# Patient Record
Sex: Female | Born: 1970
Health system: Southern US, Community
[De-identification: ages and names within clinical notes are randomized; demographics above are authoritative.]

## PROBLEM LIST (undated history)

## (undated) DIAGNOSIS — M5030 Other cervical disc degeneration, unspecified cervical region: Secondary | ICD-10-CM

## (undated) DIAGNOSIS — G43909 Migraine, unspecified, not intractable, without status migrainosus: Secondary | ICD-10-CM

## (undated) HISTORY — PX: AUGMENTATION MAMMAPLASTY: SUR837

## (undated) HISTORY — DX: Migraine, unspecified, not intractable, without status migrainosus: G43.909

## (undated) HISTORY — DX: Other cervical disc degeneration, unspecified cervical region: M50.30

---

## 1998-08-21 ENCOUNTER — Ambulatory Visit (HOSPITAL_COMMUNITY): Admission: RE | Admit: 1998-08-21 | Discharge: 1998-08-21 | Payer: Self-pay | Admitting: Obstetrics and Gynecology

## 1999-06-27 ENCOUNTER — Other Ambulatory Visit: Admission: RE | Admit: 1999-06-27 | Discharge: 1999-06-27 | Payer: Self-pay | Admitting: Obstetrics and Gynecology

## 2000-06-02 ENCOUNTER — Other Ambulatory Visit: Admission: RE | Admit: 2000-06-02 | Discharge: 2000-06-02 | Payer: Self-pay | Admitting: *Deleted

## 2000-09-17 ENCOUNTER — Ambulatory Visit (HOSPITAL_COMMUNITY): Admission: RE | Admit: 2000-09-17 | Discharge: 2000-09-17 | Payer: Self-pay | Admitting: *Deleted

## 2000-11-11 ENCOUNTER — Inpatient Hospital Stay (HOSPITAL_COMMUNITY): Admission: AD | Admit: 2000-11-11 | Discharge: 2000-11-11 | Payer: Self-pay | Admitting: Obstetrics & Gynecology

## 2000-11-12 ENCOUNTER — Inpatient Hospital Stay (HOSPITAL_COMMUNITY): Admission: AD | Admit: 2000-11-12 | Discharge: 2000-11-12 | Payer: Self-pay | Admitting: *Deleted

## 2000-11-13 ENCOUNTER — Inpatient Hospital Stay (HOSPITAL_COMMUNITY): Admission: AD | Admit: 2000-11-13 | Discharge: 2000-11-13 | Payer: Self-pay | Admitting: Obstetrics and Gynecology

## 2000-11-13 ENCOUNTER — Encounter: Payer: Self-pay | Admitting: Obstetrics and Gynecology

## 2000-11-27 ENCOUNTER — Inpatient Hospital Stay (HOSPITAL_COMMUNITY): Admission: AD | Admit: 2000-11-27 | Discharge: 2000-11-27 | Payer: Self-pay | Admitting: *Deleted

## 2000-12-17 ENCOUNTER — Inpatient Hospital Stay (HOSPITAL_COMMUNITY): Admission: AD | Admit: 2000-12-17 | Discharge: 2000-12-20 | Payer: Self-pay | Admitting: *Deleted

## 2001-01-23 ENCOUNTER — Other Ambulatory Visit: Admission: RE | Admit: 2001-01-23 | Discharge: 2001-01-23 | Payer: Self-pay | Admitting: *Deleted

## 2001-12-14 ENCOUNTER — Other Ambulatory Visit: Admission: RE | Admit: 2001-12-14 | Discharge: 2001-12-14 | Payer: Self-pay | Admitting: Obstetrics & Gynecology

## 2002-04-08 ENCOUNTER — Ambulatory Visit (HOSPITAL_COMMUNITY): Admission: RE | Admit: 2002-04-08 | Discharge: 2002-04-08 | Payer: Self-pay | Admitting: Obstetrics & Gynecology

## 2002-07-08 ENCOUNTER — Inpatient Hospital Stay (HOSPITAL_COMMUNITY): Admission: AD | Admit: 2002-07-08 | Discharge: 2002-07-09 | Payer: Self-pay | Admitting: Obstetrics & Gynecology

## 2002-08-19 ENCOUNTER — Other Ambulatory Visit: Admission: RE | Admit: 2002-08-19 | Discharge: 2002-08-19 | Payer: Self-pay | Admitting: Obstetrics & Gynecology

## 2003-08-24 ENCOUNTER — Other Ambulatory Visit: Admission: RE | Admit: 2003-08-24 | Discharge: 2003-08-24 | Payer: Self-pay | Admitting: Obstetrics & Gynecology

## 2004-02-29 ENCOUNTER — Other Ambulatory Visit: Admission: RE | Admit: 2004-02-29 | Discharge: 2004-02-29 | Payer: Self-pay | Admitting: Obstetrics and Gynecology

## 2004-02-29 ENCOUNTER — Other Ambulatory Visit: Admission: RE | Admit: 2004-02-29 | Discharge: 2004-02-29 | Payer: Self-pay | Admitting: Obstetrics & Gynecology

## 2004-06-26 ENCOUNTER — Ambulatory Visit (HOSPITAL_COMMUNITY): Admission: RE | Admit: 2004-06-26 | Discharge: 2004-06-26 | Payer: Self-pay | Admitting: Obstetrics & Gynecology

## 2004-09-06 ENCOUNTER — Inpatient Hospital Stay (HOSPITAL_COMMUNITY): Admission: AD | Admit: 2004-09-06 | Discharge: 2004-09-07 | Payer: Self-pay | Admitting: Obstetrics & Gynecology

## 2004-09-07 ENCOUNTER — Inpatient Hospital Stay (HOSPITAL_COMMUNITY): Admission: AD | Admit: 2004-09-07 | Discharge: 2004-09-07 | Payer: Self-pay | Admitting: Obstetrics and Gynecology

## 2004-09-12 ENCOUNTER — Inpatient Hospital Stay (HOSPITAL_COMMUNITY): Admission: AD | Admit: 2004-09-12 | Discharge: 2004-09-14 | Payer: Self-pay | Admitting: Obstetrics & Gynecology

## 2004-10-24 ENCOUNTER — Other Ambulatory Visit: Admission: RE | Admit: 2004-10-24 | Discharge: 2004-10-24 | Payer: Self-pay | Admitting: Obstetrics & Gynecology

## 2006-01-03 ENCOUNTER — Other Ambulatory Visit: Admission: RE | Admit: 2006-01-03 | Discharge: 2006-01-03 | Payer: Self-pay | Admitting: Obstetrics & Gynecology

## 2006-04-02 ENCOUNTER — Encounter: Payer: Self-pay | Admitting: Obstetrics and Gynecology

## 2007-04-30 ENCOUNTER — Ambulatory Visit (HOSPITAL_COMMUNITY): Admission: RE | Admit: 2007-04-30 | Discharge: 2007-04-30 | Payer: Self-pay | Admitting: Obstetrics & Gynecology

## 2007-07-11 ENCOUNTER — Inpatient Hospital Stay (HOSPITAL_COMMUNITY): Admission: AD | Admit: 2007-07-11 | Discharge: 2007-07-12 | Payer: Self-pay | Admitting: Obstetrics and Gynecology

## 2007-07-24 ENCOUNTER — Inpatient Hospital Stay (HOSPITAL_COMMUNITY): Admission: AD | Admit: 2007-07-24 | Discharge: 2007-07-26 | Payer: Self-pay | Admitting: Obstetrics and Gynecology

## 2011-09-13 LAB — RAPID HIV SCREEN (WH-MAU): Rapid HIV Screen: NONREACTIVE

## 2011-09-13 LAB — RH IMMUNE GLOB WKUP(>/=20WKS)(NOT WOMEN'S HOSP)

## 2011-09-13 LAB — CBC
HCT: 31.8 — ABNORMAL LOW
HCT: 35.4 — ABNORMAL LOW
Hemoglobin: 11.4 — ABNORMAL LOW
Hemoglobin: 12.4
MCHC: 35.1
MCHC: 35.8
MCV: 97.3
MCV: 97.5
Platelets: 116 — ABNORMAL LOW
Platelets: 150
RBC: 3.27 — ABNORMAL LOW
RBC: 3.63 — ABNORMAL LOW
RDW: 13.4
RDW: 13.4
WBC: 11.1 — ABNORMAL HIGH
WBC: 18 — ABNORMAL HIGH

## 2011-09-13 LAB — RPR: RPR Ser Ql: NONREACTIVE

## 2015-01-23 ENCOUNTER — Other Ambulatory Visit: Payer: Self-pay | Admitting: Obstetrics & Gynecology

## 2015-01-23 DIAGNOSIS — N6002 Solitary cyst of left breast: Secondary | ICD-10-CM

## 2015-01-23 DIAGNOSIS — N644 Mastodynia: Secondary | ICD-10-CM

## 2015-01-31 ENCOUNTER — Other Ambulatory Visit: Payer: Self-pay | Admitting: Obstetrics & Gynecology

## 2015-01-31 ENCOUNTER — Encounter (INDEPENDENT_AMBULATORY_CARE_PROVIDER_SITE_OTHER): Payer: Self-pay

## 2015-01-31 ENCOUNTER — Ambulatory Visit
Admission: RE | Admit: 2015-01-31 | Discharge: 2015-01-31 | Disposition: A | Discharge status: Home / Self Care | Payer: BLUE CROSS/BLUE SHIELD | Source: Ambulatory Visit | Attending: Obstetrics & Gynecology | Admitting: Obstetrics & Gynecology

## 2015-01-31 DIAGNOSIS — N6002 Solitary cyst of left breast: Secondary | ICD-10-CM

## 2015-01-31 DIAGNOSIS — N644 Mastodynia: Secondary | ICD-10-CM

## 2015-05-04 ENCOUNTER — Other Ambulatory Visit: Payer: Self-pay | Admitting: Obstetrics & Gynecology

## 2015-05-04 DIAGNOSIS — Z1231 Encounter for screening mammogram for malignant neoplasm of breast: Secondary | ICD-10-CM

## 2015-06-09 ENCOUNTER — Other Ambulatory Visit: Payer: Self-pay | Admitting: Obstetrics & Gynecology

## 2015-06-09 ENCOUNTER — Ambulatory Visit
Admission: RE | Admit: 2015-06-09 | Discharge: 2015-06-09 | Disposition: A | Discharge status: Home / Self Care | Payer: BLUE CROSS/BLUE SHIELD | Source: Ambulatory Visit | Attending: Obstetrics & Gynecology | Admitting: Obstetrics & Gynecology

## 2015-06-09 DIAGNOSIS — Z1231 Encounter for screening mammogram for malignant neoplasm of breast: Secondary | ICD-10-CM

## 2016-08-28 ENCOUNTER — Other Ambulatory Visit: Payer: Self-pay | Admitting: Obstetrics & Gynecology

## 2016-08-28 DIAGNOSIS — Z1231 Encounter for screening mammogram for malignant neoplasm of breast: Secondary | ICD-10-CM

## 2016-09-10 ENCOUNTER — Ambulatory Visit
Admission: RE | Admit: 2016-09-10 | Discharge: 2016-09-10 | Disposition: A | Discharge status: Home / Self Care | Payer: Commercial Managed Care - HMO | Source: Ambulatory Visit | Attending: Obstetrics & Gynecology | Admitting: Obstetrics & Gynecology

## 2016-09-10 ENCOUNTER — Encounter: Payer: Self-pay | Admitting: Radiology

## 2016-09-10 DIAGNOSIS — Z1231 Encounter for screening mammogram for malignant neoplasm of breast: Secondary | ICD-10-CM

## 2016-12-13 DIAGNOSIS — N390 Urinary tract infection, site not specified: Secondary | ICD-10-CM | POA: Diagnosis not present

## 2017-03-18 DIAGNOSIS — R102 Pelvic and perineal pain: Secondary | ICD-10-CM | POA: Diagnosis not present

## 2017-03-18 DIAGNOSIS — N6002 Solitary cyst of left breast: Secondary | ICD-10-CM | POA: Diagnosis not present

## 2017-03-19 ENCOUNTER — Other Ambulatory Visit: Payer: Self-pay | Admitting: Obstetrics & Gynecology

## 2017-03-19 DIAGNOSIS — N6320 Unspecified lump in the left breast, unspecified quadrant: Secondary | ICD-10-CM

## 2017-03-21 ENCOUNTER — Other Ambulatory Visit: Payer: Commercial Managed Care - HMO

## 2017-03-24 ENCOUNTER — Ambulatory Visit
Admission: RE | Admit: 2017-03-24 | Discharge: 2017-03-24 | Disposition: A | Discharge status: Home / Self Care | Payer: 59 | Source: Ambulatory Visit | Attending: Obstetrics & Gynecology | Admitting: Obstetrics & Gynecology

## 2017-03-24 ENCOUNTER — Other Ambulatory Visit: Payer: Self-pay | Admitting: Obstetrics & Gynecology

## 2017-03-24 DIAGNOSIS — N6320 Unspecified lump in the left breast, unspecified quadrant: Secondary | ICD-10-CM

## 2017-03-24 DIAGNOSIS — R928 Other abnormal and inconclusive findings on diagnostic imaging of breast: Secondary | ICD-10-CM | POA: Diagnosis not present

## 2017-03-24 DIAGNOSIS — N6489 Other specified disorders of breast: Secondary | ICD-10-CM | POA: Diagnosis not present

## 2017-04-08 ENCOUNTER — Other Ambulatory Visit: Payer: Self-pay | Admitting: Obstetrics & Gynecology

## 2017-04-08 DIAGNOSIS — Z803 Family history of malignant neoplasm of breast: Secondary | ICD-10-CM

## 2017-04-21 DIAGNOSIS — Z01419 Encounter for gynecological examination (general) (routine) without abnormal findings: Secondary | ICD-10-CM | POA: Diagnosis not present

## 2017-04-29 ENCOUNTER — Ambulatory Visit
Admission: RE | Admit: 2017-04-29 | Discharge: 2017-04-29 | Disposition: A | Discharge status: Home / Self Care | Payer: 59 | Source: Ambulatory Visit | Attending: Obstetrics & Gynecology | Admitting: Obstetrics & Gynecology

## 2017-04-29 DIAGNOSIS — Z803 Family history of malignant neoplasm of breast: Secondary | ICD-10-CM

## 2017-04-29 DIAGNOSIS — R922 Inconclusive mammogram: Secondary | ICD-10-CM | POA: Diagnosis not present

## 2017-04-29 MED ORDER — GADOBENATE DIMEGLUMINE 529 MG/ML IV SOLN
9.0000 mL | Freq: Once | INTRAVENOUS | Status: AC | PRN
  Administered 2017-04-29: 9 mL via INTRAVENOUS

## 2017-06-28 IMAGING — MR MR BILATERAL BREAST WITHOUT AND WITH CONTRAST
8 of 12 series · 33 of 48 positions shown · IV contrast (9ml multihance)
Comparison: Previous exam(s).

CLINICAL DATA: 45-year-old female with dense breasts and family
history of breast cancer. Left breast pain for 1 year.

LABS:  None
EXAM:
BILATERAL BREAST MRI WITH AND WITHOUT CONTRAST
TECHNIQUE: Multiplanar, multisequence MR images of both breasts were obtained
prior to and following the intravenous administration of 9 ml of
MultiHance.

[Series 2: t2_tirm_tra ipat (a-p) · axial · 3.0mm · 0.68mm/px · 1 of 56 slices shown]
[im 1/56]
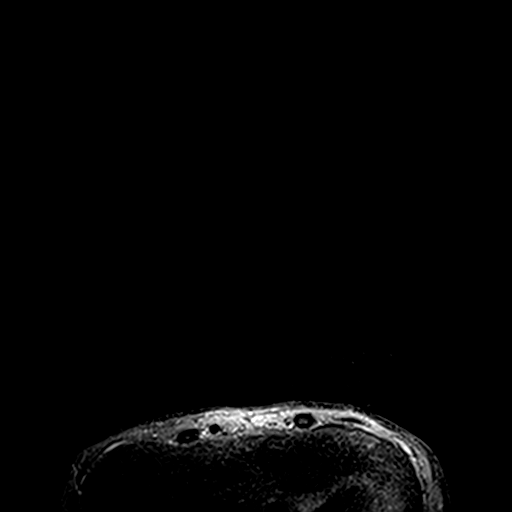

[Series 3: fl3d pre-cm no · axial · non-contrast · 1.2mm · 0.91mm/px · z∈[-67,+104]mm · 5 of 144 slices shown]
[im 1/144]
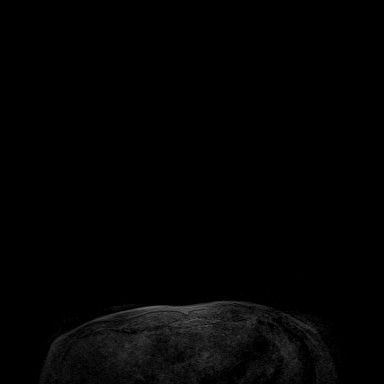
[im 36/144]
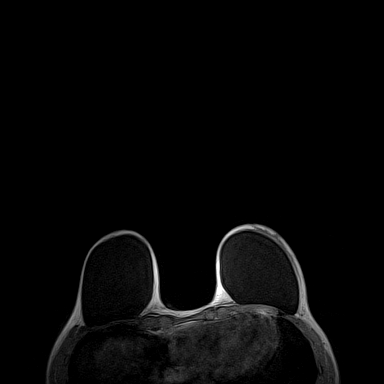
[im 72/144]
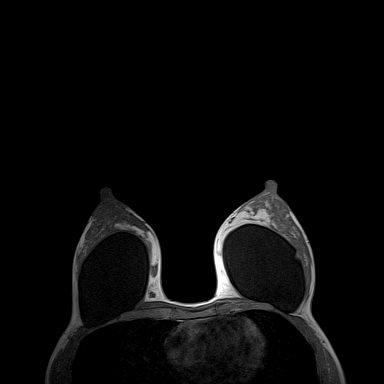
[im 108/144]
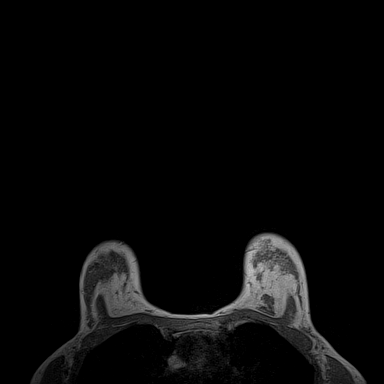
[im 144/144]
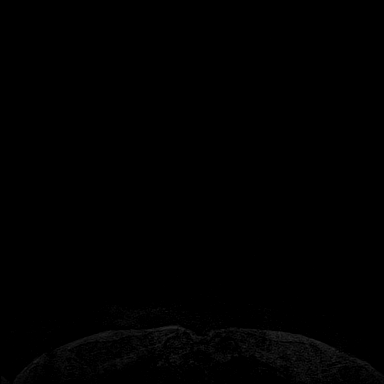

[Series 4: fl3d pre-cm · axial · non-contrast · 1.2mm · 0.91mm/px · z∈[-67,+104]mm · 5 of 144 slices shown]
[im 1/144]
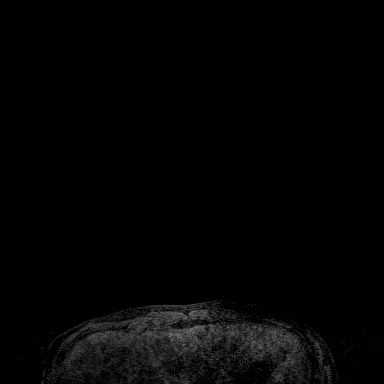
[im 36/144]
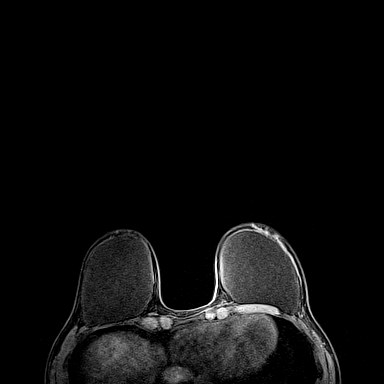
[im 72/144]
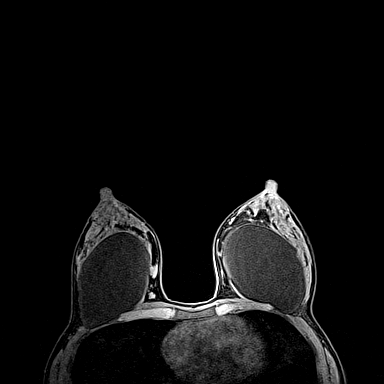
[im 108/144]
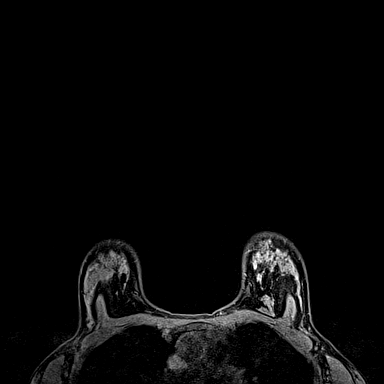
[im 144/144]
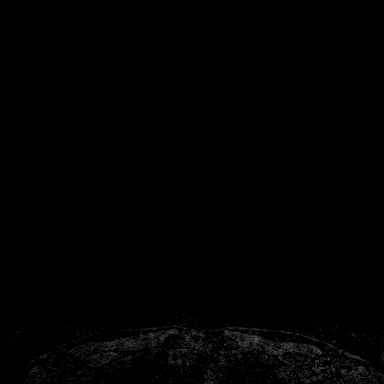

[Series 5: fl3d post immediate · axial · 1.2mm · 0.91mm/px · z∈[-67,+104]mm · 5 of 144 slices shown (1 of 3)]
[im 1/144]
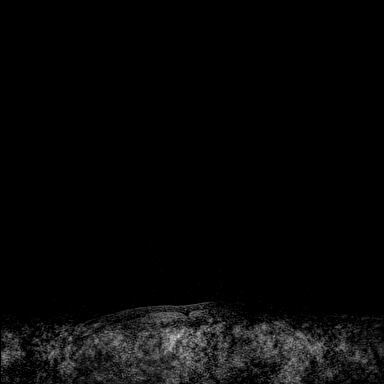
[im 36/144]
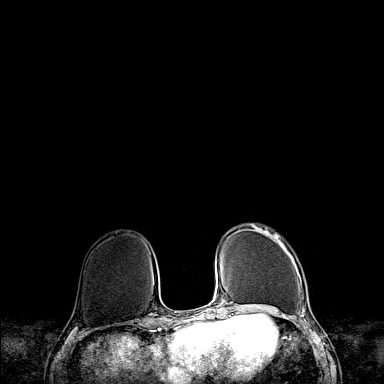
[im 72/144]
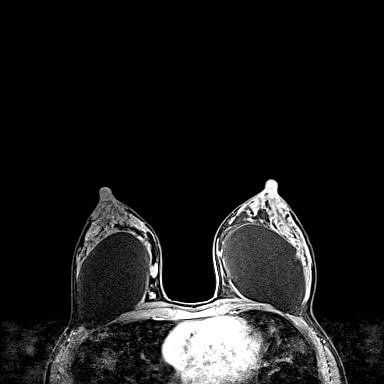
[im 108/144]
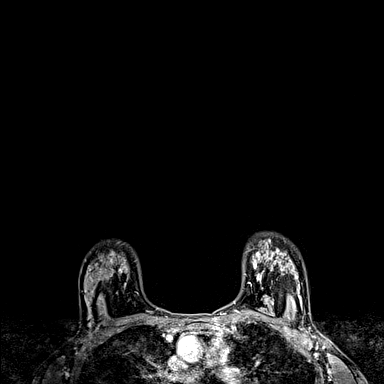
[im 144/144]
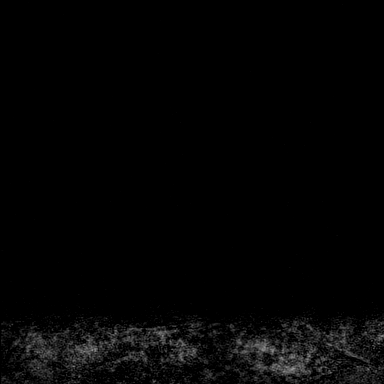

[Series 6: fl3d post immediate · axial · 1.2mm · 0.91mm/px · z∈[-67,+104]mm · 5 of 144 slices shown (2 of 3)]
[im 1/144]
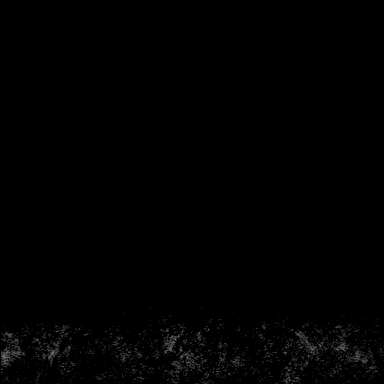
[im 36/144]
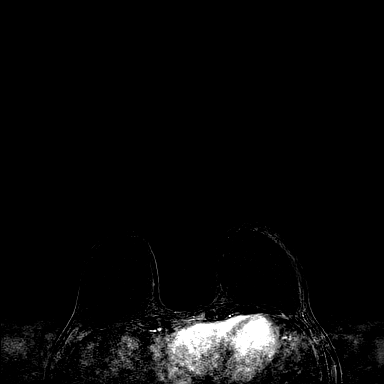
[im 72/144]
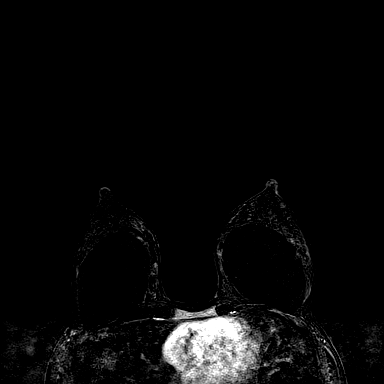
[im 108/144]
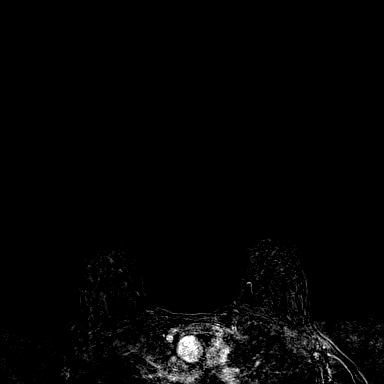
[im 144/144]
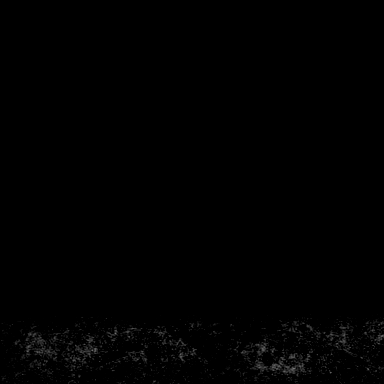

[Series 7: fl3d post immediate · axial · 172.8mm · 0.91mm/px · 1 of 1 slices shown (3 of 3)]
[im 1/1]
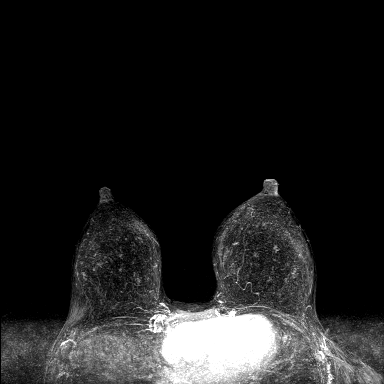

[Series 8: fl3d post 3min · axial · 1.2mm · 0.91mm/px · z∈[-67,+104]mm · 6 of 144 slices shown]
[im 1/144]
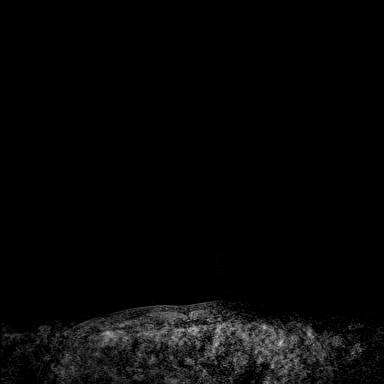
[im 29/144]
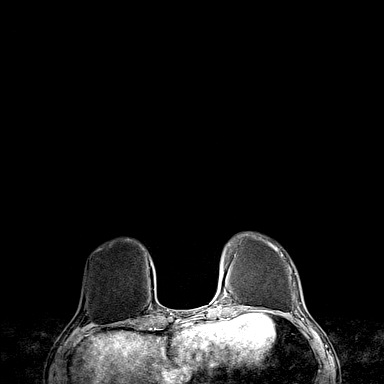
[im 58/144]
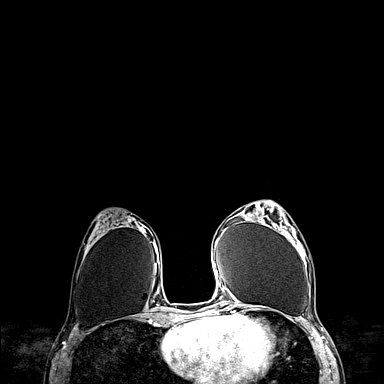
[im 86/144]
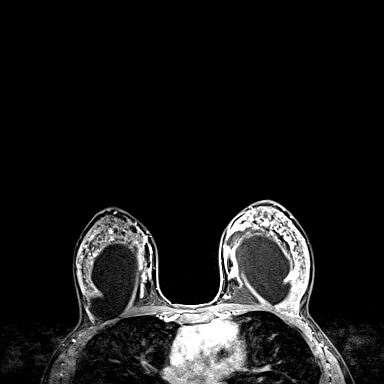
[im 115/144]
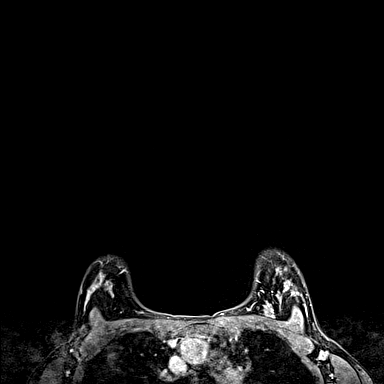
[im 144/144]
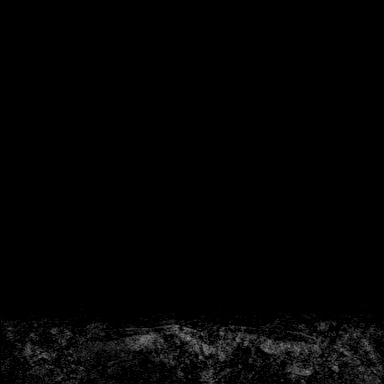

[Series 9: fl3d post 3min_sub · axial · 1.2mm · 0.91mm/px · z∈[-67,+69]mm · 5 of 144 slices shown]
[im 1/144]
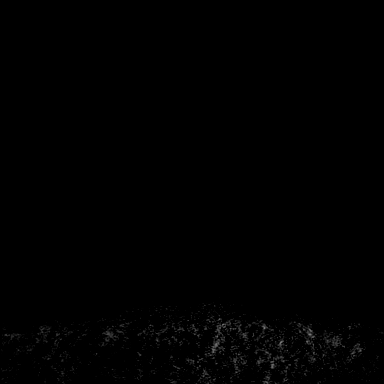
[im 29/144]
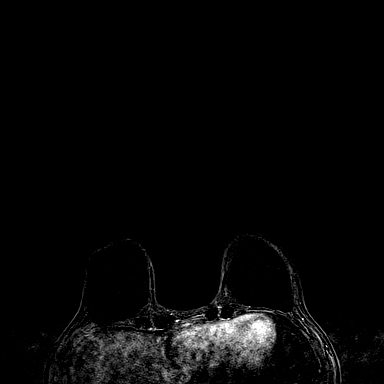
[im 58/144]
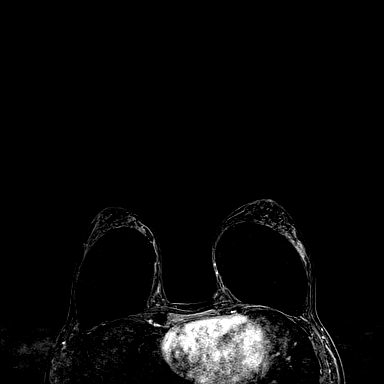
[im 86/144]
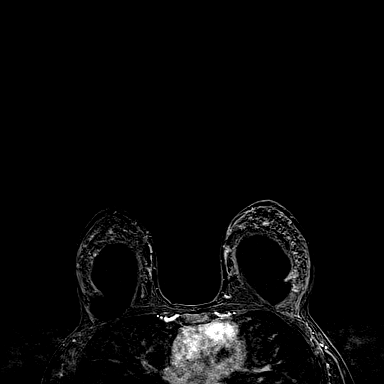
[im 115/144]
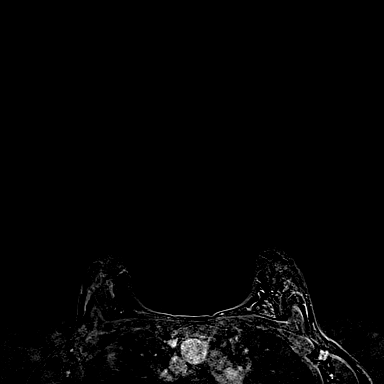

[33 of 48 positions shown; findings below may reference images not displayed]

THREE-DIMENSIONAL MR IMAGE RENDERING ON INDEPENDENT WORKSTATION:

Three-dimensional MR images were rendered by post-processing of the
original MR data on an independent workstation. The
three-dimensional MR images were interpreted, and findings are
reported in the following complete MRI report for this study. Three
dimensional images were evaluated at the independent [employer]
workstation
FINDINGS: Breast composition: c. Heterogeneous fibroglandular tissue.

Background parenchymal enhancement: Moderate.

Right breast: No mass or abnormal enhancement. A retropectoral
implant is noted.

Left breast: No mass or abnormal enhancement. A retropectoral
implant is noted.

Lymph nodes: No abnormal appearing lymph nodes.

Ancillary findings:  None.
IMPRESSION: No MR evidence of breast malignancy.

RECOMMENDATION:
Bilateral screening mammograms in 1 year.

BI-RADS CATEGORY  1: Negative.

## 2017-11-21 DIAGNOSIS — H00011 Hordeolum externum right upper eyelid: Secondary | ICD-10-CM | POA: Diagnosis not present

## 2017-11-26 DIAGNOSIS — R05 Cough: Secondary | ICD-10-CM | POA: Diagnosis not present

## 2017-11-26 DIAGNOSIS — J0100 Acute maxillary sinusitis, unspecified: Secondary | ICD-10-CM | POA: Diagnosis not present

## 2017-11-26 DIAGNOSIS — J209 Acute bronchitis, unspecified: Secondary | ICD-10-CM | POA: Diagnosis not present

## 2018-04-21 ENCOUNTER — Other Ambulatory Visit: Payer: Self-pay | Admitting: Obstetrics & Gynecology

## 2018-04-21 DIAGNOSIS — Z1231 Encounter for screening mammogram for malignant neoplasm of breast: Secondary | ICD-10-CM

## 2018-05-05 DIAGNOSIS — Z01419 Encounter for gynecological examination (general) (routine) without abnormal findings: Secondary | ICD-10-CM | POA: Diagnosis not present

## 2018-05-05 DIAGNOSIS — Z681 Body mass index (BMI) 19 or less, adult: Secondary | ICD-10-CM | POA: Diagnosis not present

## 2018-05-13 ENCOUNTER — Ambulatory Visit
Admission: RE | Admit: 2018-05-13 | Discharge: 2018-05-13 | Disposition: A | Discharge status: Home / Self Care | Payer: 59 | Source: Ambulatory Visit | Attending: Obstetrics & Gynecology | Admitting: Obstetrics & Gynecology

## 2018-05-13 DIAGNOSIS — Z1231 Encounter for screening mammogram for malignant neoplasm of breast: Secondary | ICD-10-CM | POA: Diagnosis not present

## 2018-08-18 DIAGNOSIS — Z800 Family history of malignant neoplasm of digestive organs: Secondary | ICD-10-CM | POA: Diagnosis not present

## 2018-08-18 DIAGNOSIS — M5412 Radiculopathy, cervical region: Secondary | ICD-10-CM | POA: Diagnosis not present

## 2018-08-18 DIAGNOSIS — Z1389 Encounter for screening for other disorder: Secondary | ICD-10-CM | POA: Diagnosis not present

## 2018-08-18 DIAGNOSIS — M722 Plantar fascial fibromatosis: Secondary | ICD-10-CM | POA: Diagnosis not present

## 2018-08-18 DIAGNOSIS — Z0000 Encounter for general adult medical examination without abnormal findings: Secondary | ICD-10-CM | POA: Diagnosis not present

## 2018-08-18 DIAGNOSIS — R82998 Other abnormal findings in urine: Secondary | ICD-10-CM | POA: Diagnosis not present

## 2018-09-01 DIAGNOSIS — Z1212 Encounter for screening for malignant neoplasm of rectum: Secondary | ICD-10-CM | POA: Diagnosis not present

## 2018-09-03 DIAGNOSIS — D2262 Melanocytic nevi of left upper limb, including shoulder: Secondary | ICD-10-CM | POA: Diagnosis not present

## 2018-09-03 DIAGNOSIS — L814 Other melanin hyperpigmentation: Secondary | ICD-10-CM | POA: Diagnosis not present

## 2018-09-03 DIAGNOSIS — D2261 Melanocytic nevi of right upper limb, including shoulder: Secondary | ICD-10-CM | POA: Diagnosis not present

## 2018-11-23 DIAGNOSIS — H04123 Dry eye syndrome of bilateral lacrimal glands: Secondary | ICD-10-CM | POA: Diagnosis not present

## 2019-06-23 ENCOUNTER — Other Ambulatory Visit: Payer: Self-pay

## 2019-06-23 DIAGNOSIS — Z20822 Contact with and (suspected) exposure to covid-19: Secondary | ICD-10-CM

## 2019-06-27 LAB — NOVEL CORONAVIRUS, NAA: SARS-CoV-2, NAA: NOT DETECTED

## 2019-08-02 ENCOUNTER — Other Ambulatory Visit: Payer: Self-pay | Admitting: Obstetrics & Gynecology

## 2019-08-02 DIAGNOSIS — Z1231 Encounter for screening mammogram for malignant neoplasm of breast: Secondary | ICD-10-CM

## 2019-09-16 ENCOUNTER — Ambulatory Visit
Admission: RE | Admit: 2019-09-16 | Discharge: 2019-09-16 | Disposition: A | Discharge status: Home / Self Care | Payer: 59 | Source: Ambulatory Visit | Attending: Obstetrics & Gynecology | Admitting: Obstetrics & Gynecology

## 2019-09-16 ENCOUNTER — Other Ambulatory Visit: Payer: Self-pay

## 2019-09-16 DIAGNOSIS — Z1231 Encounter for screening mammogram for malignant neoplasm of breast: Secondary | ICD-10-CM

## 2020-04-10 ENCOUNTER — Ambulatory Visit: Payer: 59 | Admitting: Neurology

## 2020-06-08 ENCOUNTER — Ambulatory Visit: Payer: 59 | Admitting: Neurology

## 2020-07-24 ENCOUNTER — Encounter: Payer: Self-pay | Admitting: *Deleted

## 2020-07-24 NOTE — Progress Notes (Signed)
[id number]    Provider:  Dr Jaynee Eagles Requesting Provider: Shon Baton, MD Primary Care Provider:  Shon Baton, MD  CC:  Chronic migraines  HPI:  Diane Cisneros is a 49 y.o. female here as requested by Shon Baton, MD for syncope and collapse. PMHx Migraines.  I reviewed Dr. Keane Police note, patient was seen in May in Dr. Keane Police office for syncope episode and fatigue, patient was seen on May 3 (Monday) and reported the previous Friday she drove to pick up her son at school and sat in the car line for 10 minutes, on the way home became hot and nauseated, felt like she was looking through 2, she pulled over, said she was not feeling good, [place] head in her hands and woke up, her son told her she was out for a couple minutes and she drove home after she felt better and she had a cold spell when she got home, no history of similar symptoms, patient also reported headaches and a history of migraines, she has been going to Dr. Lowell Bouton office for Botox for her migraines for the last 10 years, 1 week prior had her first Materna vaccine, had to take a nap on Saturday and Sunday was able to ambulate around the neighborhood.  She denied vision changes, chest pain, shortness of breath, dyspnea on exertion, focal weakness.  In review of Dr. Keane Police examination, physical examination was normal including eyes, ears, nose and throat, neck, respiratory, cardiovascular, musculoskeletal, neurologic.  Was diagnosed with likely vasovagal secondary to a stress response because child had a facial injury he was hit in the head with an elbow prior to the incident.  Botox has helped the headache, consider extending to bilateral sides of the head.  A week after she had her vaccination she had an episode where she felt lightheaded, she picked her son up, her stomach felt weird, she had that feeling like she was clammy, she was going to pass out, she was looking in a tunnel, she may have "gone out", she  has not had an episode, her son also felt his nose may be broken and he was all black and blue.  Headaches since 1992, she has daily headaches, her migraines are unilateral, pounding/pulsating, light and sound sensitivity, nausea. Her headaches are temple to temple, she gets cosmetic botox and feels that helped with migraine relief. When the botox wears off her migraines get worse. Headaches and migraines can last all day. She can wake in the morning with headaches. Worse with stress, better with a dark room and no movement and quiet. She has neck pain. Lots of tightness in her neck, she tried [facility] and nerve blocks with no results, tried "everything" for the headaches and migraines. Dry needling has helped with the neck tightness and pain. She has daily headaches and migraines. Ibuprofen/tylenpl as needed but no medication overuse she doesn't take it often, he kids can tell if she has a headaches.She has some tingling through her scalp, no aura. She changed her diet, she ha tried multiple medications with side effects. Headaches worsening almost every day. The headaches and migraines can be moderately severe to severe and when she bends over to dry her hair it gets worse and it is always nagging.   Reviewed notes, labs and imaging from outside physicians, which showed:  Reviewed lab reports which included unremarkable CBC collected Apr 04, 2020, normal BMP with BUN 9 and creatinine 0.7 collected Apr 04, 2020, TSH normal 1.3  collected Apr 04, 2020  EKG report, reviewed : Normal sinus rhythm 60 bpm  Review of Systems: Patient complains of symptoms per HPI as well as the following symptoms:headach, feeling hot. Pertinent negatives and positives per HPI. All others negative.   Social History   Socioeconomic History  . Marital status: Married    Spouse name: Not on file  . Number of children: 4  . Years of education: Not on file  . Highest education level: Some college, no degree  Occupational History    . Not on file  Tobacco Use  . Smoking status: Never Smoker  . Smokeless tobacco: Never Used  Vaping Use  . Vaping Use: Never used  Substance and Sexual Activity  . Alcohol use: Yes    Comment: " just on the weekends occasionally" 2 glasses  . Drug use: Never  . Sexual activity: Not on file  Other Topics Concern  . Not on file  Social History Narrative   Lives at home with husband and children   Right handed   Caffeine: 1 cup/day   Social Determinants of Health   Financial Resource Strain:   . Difficulty of Paying Living Expenses: Not on file  Food Insecurity:   . Worried About Charity fundraiser in the Last Year: Not on file  . Ran Out of Food in the Last Year: Not on file  Transportation Needs:   . Lack of Transportation (Medical): Not on file  . Lack of Transportation (Non-Medical): Not on file  Physical Activity:   . Days of Exercise per Week: Not on file  . Minutes of Exercise per Session: Not on file  Stress:   . Feeling of Stress : Not on file  Social Connections:   . Frequency of Communication with Friends and Family: Not on file  . Frequency of Social Gatherings with Friends and Family: Not on file  . Attends Religious Services: Not on file  . Active Member of Clubs or Organizations: Not on file  . Attends Archivist Meetings: Not on file  . Marital Status: Not on file  Intimate Partner Violence:   . Fear of Current or Ex-Partner: Not on file  . Emotionally Abused: Not on file  . Physically Abused: Not on file  . Sexually Abused: Not on file    Family History  Problem Relation Age of Onset  . Breast cancer Mother   . Migraines Mother   . Rheum arthritis Father     Past Medical History:  Diagnosis Date  . DDD (degenerative disc disease), cervical   . Migraines   . Vaginal delivery 2002, 2003, 2005, 2008   4 children    Patient Active Problem List   Diagnosis Date Noted  . Chronic migraine w/o aura w/o status migrainosus, not  intractable 07/25/2020    Past Surgical History:  Procedure Laterality Date  . AUGMENTATION MAMMAPLASTY Bilateral    2007    Current Outpatient Medications  Medication Sig Dispense Refill  . IBUPROFEN PO Take by mouth as needed.    . nortriptyline (PAMELOR) 10 MG capsule Take 1 capsule (10 mg total) by mouth at bedtime. 30 capsule 3   No current facility-administered medications for this visit.    Allergies as of 07/25/2020 - Review Complete 07/25/2020  Allergen Reaction Noted  . Azithromycin Nausea Only 07/25/2020  . Penicillins Hives 07/25/2020    Vitals: BP 105/71 (BP Location: Left Arm, Patient Position: Sitting, Cuff Size: Small)   Pulse 63  Ht 5\' 2"  (1.575 m)   Wt 108 lb (49 kg)   LMP 07/18/2020   BMI 19.75 kg/m  Last Weight:  Wt Readings from Last 1 Encounters:  07/25/20 108 lb (49 kg)   Last Height:   Ht Readings from Last 1 Encounters:  07/25/20 5\' 2"  (1.575 m)     Physical exam: Exam: Gen: NAD, conversant, well nourised, obese, well groomed                     CV: RRR, no MRG. No Carotid Bruits. No peripheral edema, warm, nontender Eyes: Conjunctivae clear without exudates or hemorrhage  Neuro: Detailed Neurologic Exam  Speech:    Speech is normal; fluent and spontaneous with normal comprehension.  Cognition:    The patient is oriented to person, place, and time;     recent and remote memory intact;     language fluent;     normal attention, concentration,     fund of knowledge Cranial Nerves:    The pupils are equal, round, and reactive to light. The fundi are flat. Visual fields are full to finger confrontation. Extraocular movements are intact. Trigeminal sensation is intact and the muscles of mastication are normal. The face is symmetric. The palate elevates in the midline. Hearing intact. Voice is normal. Shoulder shrug is normal. The tongue has normal motion without fasciculations.   Coordination: No dysmetria or ataxia   Gait:     Normal native gait  Motor Observation:    No asymmetry, no atrophy, and no involuntary movements noted. Tone:    Normal muscle tone.    Posture:    Posture is normal. normal erect    Strength:    Strength is V/V in the upper and lower limbs.      Sensation: intact to LT     Reflex Exam:  DTR's:    Deep tendon reflexes in the upper and lower extremities are normal bilaterally.   Toes:    The toes are downgoing bilaterally.   Clonus:    Clonus is absent.    Assessment/Plan:  49 year old here with chronic migraines. 1 syncopal episode likely vasovagal.  Episode of syncope: Likely vasovagal but will MRI brain w/ seizure protocol  MRI brain due to concerning symptoms of loss of consciousness/alteration of awareness, positional headaches to look for space occupying mass, chiari or intracranial hypertension (pseudotumor), seizure ocus  Medication tried/failed in the past. Blood pressure medications contraindicated. Tried Topamax in the past with side effects. Nortriptyline(did not tolerate int he past will try again). Flexeril (side effects). Gabapentin. Nerve blocks, Dry needling. Meloxicam, Ibuprofen, tylenol, other muscle relaxers.   We discussed migraine management, acute and chronic, lifestyle factors, options including oral medications, CGRP, Botox.  We have decided to initiate Botox approval for chronic migraines.  No aura, no medication overuse.  Failed multiple medications as above.  Orders Placed This Encounter  Procedures  . MR BRAIN W WO CONTRAST   Meds ordered this encounter  Medications  . nortriptyline (PAMELOR) 10 MG capsule    Sig: Take 1 capsule (10 mg total) by mouth at bedtime.    Dispense:  30 capsule    Refill:  3    Cc: Shon Baton, MD  Sarina Ill, MD  [facility] [address] [place], [place] [address]  Phone [phone] Fax [phone]

## 2020-07-25 ENCOUNTER — Encounter: Payer: Self-pay | Admitting: Neurology

## 2020-07-25 ENCOUNTER — Ambulatory Visit: Payer: 59 | Admitting: Neurology

## 2020-07-25 ENCOUNTER — Other Ambulatory Visit: Payer: Self-pay

## 2020-07-25 ENCOUNTER — Telehealth: Payer: Self-pay | Admitting: Neurology

## 2020-07-25 VITALS — BP 105/71 | HR 63 | Ht 62.0 in | Wt 108.0 lb

## 2020-07-25 DIAGNOSIS — G43709 Chronic migraine without aura, not intractable, without status migrainosus: Secondary | ICD-10-CM | POA: Diagnosis not present

## 2020-07-25 DIAGNOSIS — R519 Headache, unspecified: Secondary | ICD-10-CM | POA: Diagnosis not present

## 2020-07-25 DIAGNOSIS — R4020 Unspecified coma: Secondary | ICD-10-CM | POA: Diagnosis not present

## 2020-07-25 DIAGNOSIS — R419 Unspecified symptoms and signs involving cognitive functions and awareness: Secondary | ICD-10-CM | POA: Diagnosis not present

## 2020-07-25 DIAGNOSIS — R51 Headache with orthostatic component, not elsewhere classified: Secondary | ICD-10-CM

## 2020-07-25 MED ORDER — NORTRIPTYLINE HCL 10 MG PO CAPS: 10.0000 mg | ORAL_CAPSULE | Freq: Every day | ORAL | 3 refills | Status: AC

## 2020-07-25 NOTE — Telephone Encounter (Signed)
spoke to the patient she will get back to me to schedule  [facility] Auth: [id number] (exp. 07/25/20 to 09/08/20).

## 2020-07-25 NOTE — Patient Instructions (Addendum)
MRI of the brain w/wo contrast  OnabotulinumtoxinA injection (Medical Use) What is this medicine? ONABOTULINUMTOXINA (o na BOTT you lye num tox in eh) is a neuro-muscular blocker. This medicine is used to treat crossed eyes, eyelid spasms, severe neck muscle spasms, ankle and toe muscle spasms, and elbow, wrist, and finger muscle spasms. It is also used to treat excessive underarm sweating, to prevent chronic migraine headaches, and to treat loss of bladder control due to neurologic conditions such as multiple sclerosis or spinal cord injury. This medicine may be used for other purposes; ask your health care provider or pharmacist if you have questions. COMMON BRAND NAME(S): Botox What should I tell my health care provider before I take this medicine? They need to know if you have any of these conditions:  breathing problems  cerebral palsy spasms  difficulty urinating  heart problems  history of surgery where this medicine is going to be used  infection at the site where this medicine is going to be used  myasthenia gravis or other neurologic disease  nerve or muscle disease  surgery plans  take medicines that treat or prevent blood clots  thyroid problems  an unusual or allergic reaction to botulinum toxin, albumin, other medicines, foods, dyes, or preservatives  pregnant or trying to get pregnant  breast-feeding How should I use this medicine? This medicine is for injection into a muscle. It is given by a health care professional in a hospital or clinic setting. Talk to your pediatrician regarding the use of this medicine in children. While this drug may be prescribed for children as young as 50 years old for selected conditions, precautions do apply. Overdosage: If you think you have taken too much of this medicine contact a poison control center or emergency room at once. NOTE: This medicine is only for you. Do not share this medicine with others. What if I miss a  dose? This does not apply. What may interact with this medicine?  aminoglycoside antibiotics like gentamicin, neomycin, tobramycin  muscle relaxants  other botulinum toxin injections This list may not describe all possible interactions. Give your health care provider a list of all the medicines, herbs, non-prescription drugs, or dietary supplements you use. Also tell them if you smoke, drink alcohol, or use illegal drugs. Some items may interact with your medicine. What should I watch for while using this medicine? Visit your doctor for regular check ups. This medicine will cause weakness in the muscle where it is injected. Tell your doctor if you feel unusually weak in other muscles. Get medical help right away if you have problems with breathing, swallowing, or talking. This medicine might make your eyelids droop or make you see blurry or double. If you have weak muscles or trouble seeing do not drive a car, use machinery, or do other dangerous activities. This medicine contains albumin from human blood. It may be possible to pass an infection in this medicine, but no cases have been reported. Talk to your doctor about the risks and benefits of this medicine. If your activities have been limited by your condition, go back to your regular routine slowly after treatment with this medicine. What side effects may I notice from receiving this medicine? Side effects that you should report to your doctor or health care professional as soon as possible:  allergic reactions like skin rash, itching or hives, swelling of the face, lips, or tongue  breathing problems  changes in vision  chest pain or tightness  eye  irritation, pain  fast, irregular heartbeat  infection  numbness  speech problems  swallowing problems  unusual weakness Side effects that usually do not require medical attention (report to your doctor or health care professional if they continue or are  bothersome):  bruising or pain at site where injected  drooping eyelid  dry eyes or mouth  headache  muscles aches, pains  sensitivity to light  tearing This list may not describe all possible side effects. Call your doctor for medical advice about side effects. You may report side effects to FDA at 1-800-FDA-1088. Where should I keep my medicine? This drug is given in a hospital or clinic and will not be stored at home. NOTE: This sheet is a summary. It may not cover all possible information. If you have questions about this medicine, talk to your doctor, pharmacist, or health care provider.  2020 Elsevier/Gold Standard (2018-05-25 14:21:42)

## 2020-11-16 ENCOUNTER — Telehealth: Payer: Self-pay | Admitting: Neurology

## 2020-11-16 NOTE — Telephone Encounter (Signed)
Spoke with pt. She felt the Nortriptyline was helping at first. She was waking up feeling better. However she started getting a headache around noon each day. This past week she has woken up at night with a headache even on the Nortriptyline. Feels a "fog" a "heaviness" temple to temple across forehead. Normally OTC meds help but nothing she has tried recently help. She also stated she was ready to schedule the MRI.   I spoke with Dr Brett Fairy ([facility] physician) and discussed pt's concerns. She advised to have the pt increase Nortriptyline to 20 mg x 2 nights and see if this helps. If it makes her worse, stop the medication and call the office for further advice. Also advise to get MRI as soon as possible.   I called the pt back and discussed the instructions from Dr Brett Fairy. The pt verbalized understanding. I told her a message would be sent over to the MRI department. She verbalized appreciation.

## 2020-11-16 NOTE — Telephone Encounter (Signed)
Pt states what she was last given for her migraines did not help, pt would like a call to discuss another medication being called in for her to try

## 2020-11-20 NOTE — Telephone Encounter (Signed)
LVM for pt to call back about scheduling mri  updated [facility] auth: [id number] (exp. 11/20/20 to 01/04/21)

## 2021-01-10 ENCOUNTER — Other Ambulatory Visit: Payer: Self-pay | Admitting: Obstetrics & Gynecology

## 2021-01-10 DIAGNOSIS — Z1231 Encounter for screening mammogram for malignant neoplasm of breast: Secondary | ICD-10-CM

## 2021-03-01 ENCOUNTER — Ambulatory Visit
Admission: RE | Admit: 2021-03-01 | Discharge: 2021-03-01 | Disposition: A | Discharge status: Home / Self Care | Payer: 59 | Source: Ambulatory Visit | Attending: Obstetrics & Gynecology | Admitting: Obstetrics & Gynecology

## 2021-03-01 ENCOUNTER — Other Ambulatory Visit: Payer: Self-pay | Admitting: Obstetrics and Gynecology

## 2021-03-01 ENCOUNTER — Other Ambulatory Visit: Payer: Self-pay

## 2021-03-01 ENCOUNTER — Other Ambulatory Visit: Payer: Self-pay | Admitting: Obstetrics & Gynecology

## 2021-03-01 DIAGNOSIS — Z1231 Encounter for screening mammogram for malignant neoplasm of breast: Secondary | ICD-10-CM

## 2021-03-01 DIAGNOSIS — N644 Mastodynia: Secondary | ICD-10-CM

## 2021-03-15 ENCOUNTER — Other Ambulatory Visit: Payer: Self-pay | Admitting: Obstetrics and Gynecology

## 2021-03-15 DIAGNOSIS — N644 Mastodynia: Secondary | ICD-10-CM

## 2021-05-02 ENCOUNTER — Ambulatory Visit
Admission: RE | Admit: 2021-05-02 | Discharge: 2021-05-02 | Disposition: A | Discharge status: Home / Self Care | Payer: 59 | Source: Ambulatory Visit | Attending: Obstetrics and Gynecology | Admitting: Obstetrics and Gynecology

## 2021-05-02 ENCOUNTER — Other Ambulatory Visit: Payer: Self-pay

## 2021-05-02 ENCOUNTER — Other Ambulatory Visit: Payer: Self-pay | Admitting: Obstetrics and Gynecology

## 2021-05-02 DIAGNOSIS — N644 Mastodynia: Secondary | ICD-10-CM

## 2022-04-10 ENCOUNTER — Other Ambulatory Visit: Payer: Self-pay | Admitting: Internal Medicine

## 2022-04-10 DIAGNOSIS — Z1231 Encounter for screening mammogram for malignant neoplasm of breast: Secondary | ICD-10-CM

## 2022-05-06 ENCOUNTER — Ambulatory Visit
Admission: RE | Admit: 2022-05-06 | Discharge: 2022-05-06 | Disposition: A | Discharge status: Home / Self Care | Payer: 59 | Source: Ambulatory Visit | Attending: Internal Medicine | Admitting: Internal Medicine

## 2022-05-06 DIAGNOSIS — Z1231 Encounter for screening mammogram for malignant neoplasm of breast: Secondary | ICD-10-CM

## 2023-07-03 ENCOUNTER — Other Ambulatory Visit: Payer: Self-pay | Admitting: Obstetrics and Gynecology

## 2023-07-03 DIAGNOSIS — Z1231 Encounter for screening mammogram for malignant neoplasm of breast: Secondary | ICD-10-CM

## 2023-07-11 ENCOUNTER — Ambulatory Visit: Payer: 59

## 2023-07-22 ENCOUNTER — Ambulatory Visit: Payer: 59

## 2023-07-30 ENCOUNTER — Ambulatory Visit
Admission: RE | Admit: 2023-07-30 | Discharge: 2023-07-30 | Disposition: A | Discharge status: Home / Self Care | Payer: 59 | Source: Ambulatory Visit | Attending: Obstetrics and Gynecology | Admitting: Obstetrics and Gynecology

## 2023-07-30 DIAGNOSIS — Z1231 Encounter for screening mammogram for malignant neoplasm of breast: Secondary | ICD-10-CM

## 2024-09-14 ENCOUNTER — Other Ambulatory Visit: Payer: Self-pay | Admitting: Obstetrics and Gynecology

## 2024-09-14 DIAGNOSIS — Z1231 Encounter for screening mammogram for malignant neoplasm of breast: Secondary | ICD-10-CM

## 2024-09-28 ENCOUNTER — Ambulatory Visit

## 2024-10-19 ENCOUNTER — Other Ambulatory Visit: Payer: Self-pay | Admitting: Obstetrics and Gynecology

## 2024-10-19 ENCOUNTER — Ambulatory Visit
Admission: RE | Admit: 2024-10-19 | Discharge: 2024-10-19 | Disposition: A | Source: Ambulatory Visit | Attending: Obstetrics and Gynecology | Admitting: Obstetrics and Gynecology

## 2024-10-19 DIAGNOSIS — Z1231 Encounter for screening mammogram for malignant neoplasm of breast: Secondary | ICD-10-CM

## 2024-10-25 ENCOUNTER — Other Ambulatory Visit: Payer: Self-pay | Admitting: Obstetrics and Gynecology

## 2024-10-25 DIAGNOSIS — R928 Other abnormal and inconclusive findings on diagnostic imaging of breast: Secondary | ICD-10-CM

## 2024-11-11 ENCOUNTER — Ambulatory Visit
Admission: RE | Admit: 2024-11-11 | Discharge: 2024-11-11 | Disposition: A | Discharge status: Home / Self Care | Source: Ambulatory Visit | Attending: Obstetrics and Gynecology | Admitting: Obstetrics and Gynecology

## 2024-11-11 DIAGNOSIS — R928 Other abnormal and inconclusive findings on diagnostic imaging of breast: Secondary | ICD-10-CM
